# Patient Record
Sex: Male | Born: 1965
Health system: Southern US, Community
[De-identification: ages and names within clinical notes are randomized; demographics above are authoritative.]

## PROBLEM LIST (undated history)

## (undated) DIAGNOSIS — K219 Gastro-esophageal reflux disease without esophagitis: Secondary | ICD-10-CM

## (undated) DIAGNOSIS — E785 Hyperlipidemia, unspecified: Secondary | ICD-10-CM

## (undated) HISTORY — DX: Hyperlipidemia, unspecified: E78.5

## (undated) HISTORY — PX: NO PAST SURGERIES: SHX2092

## (undated) HISTORY — DX: Gastro-esophageal reflux disease without esophagitis: K21.9

---

## 2002-05-26 ENCOUNTER — Encounter: Payer: Self-pay | Admitting: *Deleted

## 2002-05-26 ENCOUNTER — Emergency Department (HOSPITAL_COMMUNITY): Admission: EM | Admit: 2002-05-26 | Discharge: 2002-05-26 | Payer: Self-pay | Admitting: *Deleted

## 2004-03-17 ENCOUNTER — Emergency Department (HOSPITAL_COMMUNITY): Admission: EM | Admit: 2004-03-17 | Discharge: 2004-03-17 | Payer: Self-pay | Admitting: Emergency Medicine

## 2004-03-19 ENCOUNTER — Encounter (HOSPITAL_COMMUNITY): Admission: RE | Admit: 2004-03-19 | Discharge: 2004-03-20 | Payer: Self-pay | Admitting: Family Medicine

## 2005-08-06 IMAGING — NM NM MYOCAR PERF WALL MOTION
1 series · 6 of 6 positions shown · non-contrast
Comparison: none

CLINICAL DATA: Chest pain. 
 MYOCARDIAL PERFUSION SPECT MULTIPLE, EJECTION FRACTION, AND WALL MOTION
 The patient underwent treadmill stress test exercising 9 minutes 53 seconds and achieving 91 percent of target heart rate (11.6 METS).  At peak exercise 30 millicuries of technetium 77m-sestamibi was injected intravenously for myocardial perfusion imaging.  Resting exam performed on second day using 20 millicuries of technetium 77m-sestamibi.  
 Myocardial perfusion SPECT images obtained after exercise were normal.  Resting exam unchanged.  No pulmonary uptake of tracer.
 Normal left ventricular ejection fraction of 68 percent calculated from the gated SPECT images after exercise.  This is derived from an end-diastolic volume calculation of 89 ml and end-systolic volume calculation of 29 ml.
 Normal wall motion.
 IMPRESSION
 Normal exam.

[Series 1: cs cardiac tc hi dose · 6.52mm/px · 6 of 512 frames shown]
[frame 43/512]
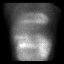
[frame 128/512]
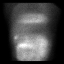
[frame 214/512]
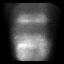
[frame 299/512]
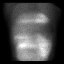
[frame 384/512]
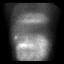
[frame 470/512]
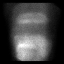

[6 of 6 positions shown; findings below may reference images not displayed]

## 2013-02-06 ENCOUNTER — Telehealth: Payer: Self-pay | Admitting: Family Medicine

## 2013-02-06 ENCOUNTER — Other Ambulatory Visit: Payer: Self-pay | Admitting: Family Medicine

## 2013-02-06 DIAGNOSIS — M25529 Pain in unspecified elbow: Secondary | ICD-10-CM

## 2013-02-06 NOTE — Telephone Encounter (Signed)
Pt seen here 09/26/12 with R arm pain and elbow pain see note in paper chart.  Was told if continued we could refer.  Pt requesting referral.  Please advise.

## 2013-02-06 NOTE — Telephone Encounter (Signed)
Patient notified that ortho referral has been processed in the system. Patient verbalized understanding.

## 2013-02-06 NOTE — Telephone Encounter (Signed)
Will refer to ortho, referral put in.Romeo Apple)

## 2013-02-22 ENCOUNTER — Ambulatory Visit (INDEPENDENT_AMBULATORY_CARE_PROVIDER_SITE_OTHER): Payer: 59 | Admitting: Orthopedic Surgery

## 2013-02-22 ENCOUNTER — Ambulatory Visit (INDEPENDENT_AMBULATORY_CARE_PROVIDER_SITE_OTHER): Payer: 59

## 2013-02-22 ENCOUNTER — Encounter: Payer: Self-pay | Admitting: Orthopedic Surgery

## 2013-02-22 VITALS — BP 136/100 | Ht 68.0 in | Wt 183.0 lb

## 2013-02-22 DIAGNOSIS — T148XXA Other injury of unspecified body region, initial encounter: Secondary | ICD-10-CM

## 2013-02-22 DIAGNOSIS — M25521 Pain in right elbow: Secondary | ICD-10-CM | POA: Insufficient documentation

## 2013-02-22 DIAGNOSIS — M25529 Pain in unspecified elbow: Secondary | ICD-10-CM

## 2013-02-22 NOTE — Patient Instructions (Addendum)
MRI f/u

## 2013-02-22 NOTE — Progress Notes (Signed)
  Subjective:    Paul Berg is a 47 y.o. male who presents with right elbow pain. Onset of the symptoms was 8 months ago. Inciting event: injury while connecting a hose on a tanker, it was hard to attach and when he twisted it he felt medial elbow pain . Current symptoms include: pain radiating to the forearm, point tenderness over the pronator teres and swelling. Pain is aggravated by: lifting heavy objects, supination/pronation as when opening doors. Symptoms have progressed to a point and plateaued. Patient has had no prior elbow problems. Evaluation to date: none. Treatment to date: ibuprofen and voltaren .  The following portions of the patient's history were reviewed and updated as appropriate: allergies, current medications, past family history, past medical history, past social history, past surgical history and problem list.  Review of Systems A comprehensive review of systems was negative. except: heartburn and snoring    Objective:    BP 136/100  Ht 5\' 8"  (1.727 m)  Wt 183 lb (83.008 kg)  BMI 27.83 kg/m2 Physical Exam(12)  1.GENERAL: normal development   2. CDV: radial and ulnar pulses are normal   3. Skin: right arm and left arm skin normal  4. Lymph: nodes were not palpable/normal in the right arm   5/6. Psychiatric: awake, alert and oriented, mood and affect normal   7. Neuro: normal sensation right arm   8.   MSK  Gait: non contributory and normal  9.   Inspection right medial elbow palpable defect medial side of elbow/ left elbow normal  10. Range of Motion normal  11. Motor weak and painful wrist flexion  12. Stability of the elbow normal    Imaging  Elbow film normal   Assessment: Pronator Teres tear right arm   Plan: MRI, patient wants repaired

## 2013-02-27 ENCOUNTER — Ambulatory Visit (HOSPITAL_COMMUNITY): Payer: 59

## 2013-02-27 ENCOUNTER — Ambulatory Visit (HOSPITAL_COMMUNITY)
Admission: RE | Admit: 2013-02-27 | Discharge: 2013-02-27 | Disposition: A | Discharge status: Home / Self Care | Payer: 59 | Source: Ambulatory Visit | Attending: Orthopedic Surgery | Admitting: Orthopedic Surgery

## 2013-02-27 DIAGNOSIS — T148XXA Other injury of unspecified body region, initial encounter: Secondary | ICD-10-CM

## 2013-02-27 DIAGNOSIS — M25521 Pain in right elbow: Secondary | ICD-10-CM

## 2013-02-27 DIAGNOSIS — M7700 Medial epicondylitis, unspecified elbow: Secondary | ICD-10-CM | POA: Insufficient documentation

## 2013-02-27 DIAGNOSIS — M25529 Pain in unspecified elbow: Secondary | ICD-10-CM | POA: Insufficient documentation

## 2013-02-27 DIAGNOSIS — M6580 Other synovitis and tenosynovitis, unspecified site: Secondary | ICD-10-CM | POA: Insufficient documentation

## 2013-03-01 ENCOUNTER — Other Ambulatory Visit (HOSPITAL_COMMUNITY): Payer: 59

## 2013-03-06 ENCOUNTER — Encounter: Payer: Self-pay | Admitting: Orthopedic Surgery

## 2013-03-06 ENCOUNTER — Ambulatory Visit (INDEPENDENT_AMBULATORY_CARE_PROVIDER_SITE_OTHER): Payer: 59 | Admitting: Orthopedic Surgery

## 2013-03-06 VITALS — BP 136/93 | Ht 68.0 in | Wt 183.0 lb

## 2013-03-06 DIAGNOSIS — M7701 Medial epicondylitis, right elbow: Secondary | ICD-10-CM

## 2013-03-06 DIAGNOSIS — M7700 Medial epicondylitis, unspecified elbow: Secondary | ICD-10-CM

## 2013-03-06 NOTE — Progress Notes (Signed)
Patient ID: Paul Berg, male   DOB: 1965-09-11, 47 y.o.   MRN: [id number] Chief Complaint  Patient presents with  . Follow-up    MRI results of right elbow.    Ht 5\' 8"  (1.727 m)  Wt 183 lb (83.008 kg)  BMI 27.83 kg/m2  MRI results right elbow  There is scarring in inflammatory tissue at the flexor pronator mass consistent with medial epicondylitis  Still having pain over the medial epicondyle and soft tissue  Review of systems no neurologic symptoms related to the ulnar nerve  Vital signs as recorded  Recommend injection  Medial epicondyle injection. (right)  Consent.  Timeout to confirm site.  Right elbow was injected 1 finger breath distal to the medial epicondyle with Depo-Medrol 40 mg and lidocaine 1% 3 cc with sterile technique using alcohol and ethyl chloride prep.  There were no complications

## 2013-03-06 NOTE — Patient Instructions (Addendum)
You have received a steroid shot. 15% of patients experience increased pain at the injection site with in the next 24 hours. This is best treated with ice and tylenol extra strength 2 tabs every 8 hours. If you are still having pain please call the office.  Medial Epicondylitis (Golfer's Elbow)  Medial epicondylitis involves inflammation and pain around the inner (medial) portion of the elbow. This pain is caused by inflammation of the tendons in the forearm that flex (bring down) the wrist. Medial epicondylitis is also called golfer's elbow, because it is common among golfers. However, it may occur in any individual who flexes the wrist regularly. If medial epicondylitis is left untreated, it may become a chronic problem. SYMPTOMS   Pain, tenderness, or inflammation over the inner (medial) side of the elbow.  Pain or weakness with gripping activities.  Pain that increases with wrist twisting motions (using a screwdriver, playing golf, bowling). CAUSES  Medial epicondylitis is caused by inflammation of the tendons that flex the wrist. Causes of injury may include:  Chronic, repetitive stress and strain to the tendons that run from the wrist and forearm to the elbow.  Sudden strain on the forearm, including wrist snap when serving balls with racquet sports, or throwing a baseball. RISK INCREASES WITH:  Sports or occupations that require repetitive and/or strenuous forearm and wrist movements (pitching a baseball, golfing, carpentry).  Poor wrist and forearm strength and flexibility.  Failure to warm up properly before activity.  Resuming activity before healing, rehabilitation, and conditioning are complete. PREVENTION   Warm up and stretch properly before activity.  Maintain physical fitness:  Strength, flexibility, and endurance.  Cardiovascular fitness.  Wear and use properly fitted equipment.  Learn and use proper technique and have a coach correct improper technique.  Wear  a tennis elbow (counterforce) brace. PROGNOSIS  The course of this condition depends on the degree of the injury. If treated properly, acute cases (symptoms lasting less than 4 weeks) are often resolved in 2 to 6 weeks. Chronic (longer lasting cases) often resolve in 3 to 6 months, but may require physical therapy. RELATED COMPLICATIONS   Frequently recurring symptoms, resulting in a chronic problem. Properly treating the problem the first time decreases frequency of recurrence.  Chronic inflammation, scarring, and partial tendon tear, requiring surgery.  Delayed healing or resolution of symptoms. TREATMENT  Treatment first involves the use of ice and medicine, to reduce pain and inflammation. Strengthening and stretching exercises may reduce discomfort, if performed regularly. These exercises may be performed at home, if the condition is an acute injury. Chronic cases may require a referral to a physical therapist for evaluation and treatment. Your caregiver may advise a corticosteroid injection to help reduce inflammation. Rarely, surgery is needed. MEDICATION  If pain medicine is needed, nonsteroidal anti-inflammatory medicines (aspirin and ibuprofen), or other minor pain relievers (acetaminophen), are often advised.  Do not take pain medicine for 7 days before surgery.  Prescription pain relievers may be given, if your caregiver thinks they are needed. Use only as directed and only as much as you need.  Corticosteroid injections may be recommended. These injections should be reserved only for the most severe cases, because they can only be given a certain number of times. HEAT AND COLD  Cold treatment (icing) should be applied for 10 to 15 minutes every 2 to 3 hours for inflammation and pain, and immediately after activity that aggravates your symptoms. Use ice packs or an ice massage.  Heat treatment may  be used before performing stretching and strengthening activities prescribed by  your caregiver, physical therapist, or athletic trainer. Use a heat pack or a warm water soak. SEEK MEDICAL CARE IF: Symptoms get worse or do not improve in 2 weeks, despite treatment.

## 2013-03-28 ENCOUNTER — Ambulatory Visit: Payer: 59 | Admitting: Orthopedic Surgery

## 2013-03-30 ENCOUNTER — Ambulatory Visit (INDEPENDENT_AMBULATORY_CARE_PROVIDER_SITE_OTHER): Payer: 59 | Admitting: Orthopedic Surgery

## 2013-03-30 ENCOUNTER — Encounter: Payer: Self-pay | Admitting: Orthopedic Surgery

## 2013-03-30 DIAGNOSIS — M7700 Medial epicondylitis, unspecified elbow: Secondary | ICD-10-CM

## 2013-03-30 DIAGNOSIS — M7701 Medial epicondylitis, right elbow: Secondary | ICD-10-CM

## 2013-03-30 NOTE — Patient Instructions (Signed)
Brace  Call office if wants to schedule surgery

## 2013-03-30 NOTE — Progress Notes (Signed)
Patient ID: Paul Berg, male   DOB: [birth year], 47 y.o.   MRN: [id number]  Chief complaint followup right elbow pain  History medial epicondyle pain treated with injection rest anti-inflammatories  . He did did help from the injection for a day and then he played golf and pain came back  He denies numbness or tingling pain with power grip  There were no vitals taken for this visit. General appearance is normal, the patient is alert and oriented x3 with normal mood and affect. The tendon area is still painful to palpation full range of motion pain with  Elbow stable motor exam normal skin intact good neurovascular exam  We discussed surgery. He wasn't think about it. I went over the instructions for wearing the tennis elbow strap which he was not wearing properly  He'll call us if he wants to have surgery  We would re\re attach the tendon after detachment and stimulation of bone.

## 2014-06-04 ENCOUNTER — Encounter (HOSPITAL_COMMUNITY): Payer: Self-pay | Admitting: *Deleted

## 2014-06-04 ENCOUNTER — Emergency Department (HOSPITAL_COMMUNITY)
Admission: EM | Admit: 2014-06-04 | Discharge: 2014-06-04 | Disposition: A | Discharge status: Home / Self Care | Payer: 59 | Attending: Emergency Medicine | Admitting: Emergency Medicine

## 2014-06-04 DIAGNOSIS — X58XXXA Exposure to other specified factors, initial encounter: Secondary | ICD-10-CM | POA: Diagnosis not present

## 2014-06-04 DIAGNOSIS — R21 Rash and other nonspecific skin eruption: Secondary | ICD-10-CM | POA: Diagnosis present

## 2014-06-04 DIAGNOSIS — Y998 Other external cause status: Secondary | ICD-10-CM | POA: Diagnosis not present

## 2014-06-04 DIAGNOSIS — T7840XA Allergy, unspecified, initial encounter: Secondary | ICD-10-CM | POA: Diagnosis not present

## 2014-06-04 DIAGNOSIS — Y9289 Other specified places as the place of occurrence of the external cause: Secondary | ICD-10-CM | POA: Insufficient documentation

## 2014-06-04 DIAGNOSIS — Y9389 Activity, other specified: Secondary | ICD-10-CM | POA: Diagnosis not present

## 2014-06-04 DIAGNOSIS — Z87891 Personal history of nicotine dependence: Secondary | ICD-10-CM | POA: Insufficient documentation

## 2014-06-04 MED ORDER — DIPHENHYDRAMINE HCL 50 MG/ML IJ SOLN
50.0000 mg | Freq: Once | INTRAMUSCULAR | Status: AC
  Administered 2014-06-04: 50 mg via INTRAVENOUS
  Filled 2014-06-04: qty 1

## 2014-06-04 MED ORDER — EPINEPHRINE 0.3 MG/0.3ML IJ SOAJ
0.3000 mg | Freq: Once | INTRAMUSCULAR | Status: AC
  Administered 2014-06-04: 0.3 mg via INTRAMUSCULAR
  Filled 2014-06-04: qty 0.3

## 2014-06-04 MED ORDER — LORATADINE 10 MG PO TABS: 10.0000 mg | ORAL_TABLET | Freq: Every day | ORAL | Status: DC

## 2014-06-04 MED ORDER — ONDANSETRON HCL 4 MG/2ML IJ SOLN
4.0000 mg | Freq: Once | INTRAMUSCULAR | Status: AC
  Administered 2014-06-04: 4 mg via INTRAVENOUS
  Filled 2014-06-04: qty 2

## 2014-06-04 MED ORDER — EPINEPHRINE 0.3 MG/0.3ML IJ SOAJ: 0.3000 mg | Freq: Once | INTRAMUSCULAR | Status: AC

## 2014-06-04 MED ORDER — SODIUM CHLORIDE 0.9 % IV BOLUS (SEPSIS)
2000.0000 mL | Freq: Once | INTRAVENOUS | Status: AC
  Administered 2014-06-04: 2000 mL via INTRAVENOUS

## 2014-06-04 MED ORDER — FAMOTIDINE 20 MG PO TABS: 20.0000 mg | ORAL_TABLET | Freq: Two times a day (BID) | ORAL | Status: DC

## 2014-06-04 MED ORDER — FAMOTIDINE IN NACL 20-0.9 MG/50ML-% IV SOLN
20.0000 mg | Freq: Once | INTRAVENOUS | Status: AC
  Administered 2014-06-04: 20 mg via INTRAVENOUS
  Filled 2014-06-04: qty 50

## 2014-06-04 MED ORDER — METHYLPREDNISOLONE SODIUM SUCC 125 MG IJ SOLR
125.0000 mg | Freq: Once | INTRAMUSCULAR | Status: AC
  Administered 2014-06-04: 125 mg via INTRAVENOUS
  Filled 2014-06-04: qty 2

## 2014-06-04 MED ORDER — PREDNISONE 20 MG PO TABS: ORAL_TABLET | ORAL | Status: DC

## 2014-06-04 MED ORDER — DIPHENHYDRAMINE HCL 25 MG PO TABS: 50.0000 mg | ORAL_TABLET | ORAL | Status: DC | PRN

## 2014-06-04 NOTE — ED Provider Notes (Signed)
CSN: [id number]     Arrival date & time 06/04/14  2131 History  This chart was scribe for No att. providers found by Judithann Sauger, ED Scribe. The patient was seen in room APA07/APA07 and the patient's care was started at 9:56 PM.   Chief Complaint  Patient presents with  . Allergic Reaction    HPI HPI Comments: Paul Berg is a 48 y.o. male who presents to the Emergency Department complaining of a generalized rash and redness that has been reoccurrent over the past few years, once every few months with the most recent onset 1 hour ago lastly a couple of hours. He explains that it starts off as wells, big pink blotches and his mouth starts watering, his tongue swells up, there is tightness in his throat, SOB, nausea and diaphoresis. He adds that his toe nails also turned gray. He reports that he is not currently on any medication but took some benadryl with slight relief PTA. There is no more tongue swelling or SOB, just the nausea, redness and rash. He felt like he was going to pass out during the break out. He denies any previous medical problems.     History reviewed. No pertinent past medical history. History reviewed. No pertinent past surgical history. History reviewed. No pertinent family history. History  Substance Use Topics  . Smoking status: Former Research scientist (life sciences)  . Smokeless tobacco: Not on file  . Alcohol Use: Yes    Review of Systems  10 Systems reviewed and are negative for acute change except as noted in the HPI.  Allergies  Pineapple  Home Medications   Prior to Admission medications   Medication Sig Start Date End Date Taking? Authorizing Provider  diphenhydrAMINE (BENADRYL) 25 MG tablet Take 2 tablets (50 mg total) by mouth every 4 (four) hours as needed for itching. 06/04/14   Babette Relic, MD  EPINEPHrine 0.3 mg/0.3 mL IJ SOAJ injection Inject 0.3 mLs (0.3 mg total) into the muscle once. 06/04/14   Babette Relic, MD  famotidine (PEPCID) 20 MG tablet Take 1  tablet (20 mg total) by mouth 2 (two) times daily. 06/04/14   Babette Relic, MD  loratadine (CLARITIN) 10 MG tablet Take 1 tablet (10 mg total) by mouth daily. One po daily x 5 days 06/04/14   Babette Relic, MD  predniSONE (DELTASONE) 20 MG tablet 2 tabs po daily x 3 days 06/04/14   Babette Relic, MD   BP 118/82 mmHg  Pulse 78  Temp(Src)   Resp 18  Ht 5\' 8"  (1.727 m)  Wt 183 lb (83.008 kg)  BMI 27.83 kg/m2  SpO2 100% Physical Exam  Constitutional:  Awake, alert, nontoxic appearance.  HENT:  Head: Atraumatic.  Eyes: Right eye exhibits no discharge. Left eye exhibits no discharge.  Neck: Neck supple.  Cardiovascular: Normal rate and regular rhythm.   No murmur heard. Pulmonary/Chest: Effort normal and breath sounds normal. No respiratory distress. He has no wheezes. He has no rales. He exhibits no tenderness.  Abdominal: Soft. There is no tenderness. There is no rebound.  Musculoskeletal: He exhibits no tenderness.  Baseline ROM, no obvious new focal weakness.  Neurological: He is alert.  Mental status and motor strength appears baseline for patient and situation.  Skin: Rash noted. There is erythema.  Generalized erythema; no purpura/petechia/vessicles  Psychiatric: He has a normal mood and affect.  Nursing note and vitals reviewed.   ED Course  Procedures (including critical care time) DIAGNOSTIC STUDIES: Oxygen  Saturation is 98% on RA, normal by my interpretation.    COORDINATION OF CARE: 10:03 PM- Pt advised of plan for treatment and pt agrees. Feels back to normal. 2335 Labs Review Labs Reviewed - No data to display  Imaging Review No results found.   EKG Interpretation None      MDM   Final diagnoses:  Allergic reaction, initial encounter    I doubt any other EMC precluding discharge at this time including, but not necessarily limited to the following:TEN. SJS, SBI.  I personally performed the services described in this documentation, which was scribed in  my presence. The recorded information has been reviewed and is accurate.    Babette Relic, MD 06/21/14 [phone]

## 2014-06-04 NOTE — ED Notes (Signed)
Patient verbalizes understanding of discharge instructions, prescription medications, home care and follow up care. Patient ambulatory out of department at this time with family. 

## 2014-06-04 NOTE — ED Notes (Signed)
Pt has generalized rash,  Took benadryl50 mg pta.  Had felt sob and throat swelling prior to taking benadryl.  Skin is red.

## 2014-12-31 ENCOUNTER — Encounter: Payer: Self-pay | Admitting: Family Medicine

## 2014-12-31 ENCOUNTER — Ambulatory Visit (INDEPENDENT_AMBULATORY_CARE_PROVIDER_SITE_OTHER): Payer: 59 | Admitting: Family Medicine

## 2014-12-31 VITALS — BP 120/80 | Ht 68.0 in | Wt 165.4 lb

## 2014-12-31 DIAGNOSIS — Z0000 Encounter for general adult medical examination without abnormal findings: Secondary | ICD-10-CM | POA: Diagnosis not present

## 2014-12-31 DIAGNOSIS — R5383 Other fatigue: Secondary | ICD-10-CM

## 2014-12-31 NOTE — Progress Notes (Signed)
   Subjective:    Patient ID: Paul Berg, male    DOB: 1966-01-09, 49 y.o.   MRN: [id number]  HPI The patient comes in today for a wellness visit.    A review of their health history was completed.  A review of medications was also completed.  Any needed refills: N/A  Eating habits: good  Falls/  MVA accidents in past few months: none  Regular exercise: yess  Specialist pt sees on regular basis: none  Preventative health issues were discussed.  Patient tries to be safe with his driving. He does try to eat relatively healthy during the week. He also tries to keep physically active and does workouts on a regular basis. Patient denies any rectal bleeding denies any hematuria. Denies chest tightness pressure pain Additional concerns: none  Review of Systems  Constitutional: Negative for fever, activity change and appetite change.  HENT: Negative for congestion and rhinorrhea.   Eyes: Negative for discharge.  Respiratory: Negative for cough and wheezing.   Cardiovascular: Negative for chest pain.  Gastrointestinal: Negative for vomiting, abdominal pain and blood in stool.  Genitourinary: Negative for frequency and difficulty urinating.  Musculoskeletal: Negative for neck pain.  Skin: Negative for rash.  Allergic/Immunologic: Negative for environmental allergies and food allergies.  Neurological: Negative for weakness and headaches.  Psychiatric/Behavioral: Negative for agitation.       Objective:   Physical Exam  Constitutional: He appears well-nourished. No distress.  Cardiovascular: Normal rate, regular rhythm and normal heart sounds.   No murmur heard. Pulmonary/Chest: Effort normal and breath sounds normal. No respiratory distress.  Musculoskeletal: He exhibits no edema.  Lymphadenopathy:    He has no cervical adenopathy.  Neurological: He is alert.  Psychiatric: His behavior is normal.  Vitals reviewed.         Assessment & Plan:  Wellness-safety  measures dietary measures discussed Patient drinks quite a few beers on the weekend he was encouraged to keep that to 4 or less per day on the weekend. He denies drinking during the week.  Patient did smoke Colonoscopy PSA not indicated until age 40.

## 2015-01-01 LAB — BASIC METABOLIC PANEL
BUN/Creatinine Ratio: 8 — ABNORMAL LOW (ref 9–20)
BUN: 10 mg/dL (ref 6–24)
CHLORIDE: 104 mmol/L (ref 97–108)
CO2: 22 mmol/L (ref 18–29)
CREATININE: 1.25 mg/dL (ref 0.76–1.27)
Calcium: 9.7 mg/dL (ref 8.7–10.2)
GFR calc non Af Amer: 68 mL/min/{1.73_m2} (ref >59)
GFR, EST AFRICAN AMERICAN: 79 mL/min/{1.73_m2} (ref >59)
Glucose: 77 mg/dL (ref 65–99)
POTASSIUM: 5 mmol/L (ref 3.5–5.2)
Sodium: 144 mmol/L (ref 134–144)

## 2015-01-01 LAB — CBC WITH DIFFERENTIAL/PLATELET
Basophils Absolute: 0.1 10*3/uL (ref 0.0–0.2)
Basos: 1 %
EOS (ABSOLUTE): 0.1 10*3/uL (ref 0.0–0.4)
Eos: 2 %
HEMATOCRIT: 48.5 % (ref 37.5–51.0)
Hemoglobin: 16.6 g/dL (ref 12.6–17.7)
Immature Grans (Abs): 0 10*3/uL (ref 0.0–0.1)
Immature Granulocytes: 0 %
LYMPHS: 33 %
Lymphocytes Absolute: 2 10*3/uL (ref 0.7–3.1)
MCH: 31.4 pg (ref 26.6–33.0)
MCHC: 34.2 g/dL (ref 31.5–35.7)
MCV: 92 fL (ref 79–97)
Monocytes Absolute: 0.6 10*3/uL (ref 0.1–0.9)
Monocytes: 9 %
NEUTROS PCT: 55 %
Neutrophils Absolute: 3.2 10*3/uL (ref 1.4–7.0)
Platelets: 257 10*3/uL (ref 150–379)
RBC: 5.29 x10E6/uL (ref 4.14–5.80)
RDW: 12.9 % (ref 12.3–15.4)
WBC: 5.9 10*3/uL (ref 3.4–10.8)

## 2015-01-01 LAB — LIPID PANEL
CHOLESTEROL TOTAL: 188 mg/dL (ref 100–199)
Chol/HDL Ratio: 4.9 ratio units (ref 0.0–5.0)
HDL: 38 mg/dL — AB (ref >39)
LDL CALC: 113 mg/dL — AB (ref 0–99)
TRIGLYCERIDES: 186 mg/dL — AB (ref 0–149)
VLDL CHOLESTEROL CAL: 37 mg/dL (ref 5–40)

## 2015-01-02 ENCOUNTER — Encounter: Payer: Self-pay | Admitting: Family Medicine

## 2018-07-26 ENCOUNTER — Encounter: Payer: 59 | Admitting: Family Medicine

## 2018-08-05 ENCOUNTER — Encounter: Payer: Self-pay | Admitting: Family Medicine

## 2018-08-05 ENCOUNTER — Ambulatory Visit (INDEPENDENT_AMBULATORY_CARE_PROVIDER_SITE_OTHER): Payer: 59 | Admitting: Family Medicine

## 2018-08-05 VITALS — BP 134/90 | Ht 68.0 in | Wt 199.4 lb

## 2018-08-05 DIAGNOSIS — Z79899 Other long term (current) drug therapy: Secondary | ICD-10-CM | POA: Diagnosis not present

## 2018-08-05 DIAGNOSIS — Z0000 Encounter for general adult medical examination without abnormal findings: Secondary | ICD-10-CM

## 2018-08-05 DIAGNOSIS — Z13220 Encounter for screening for lipoid disorders: Secondary | ICD-10-CM

## 2018-08-05 DIAGNOSIS — Z125 Encounter for screening for malignant neoplasm of prostate: Secondary | ICD-10-CM

## 2018-08-05 NOTE — Patient Instructions (Signed)
    Gabriel Cirri if you need [place] to set up the referral please let us know- Dr Nicki Reaper Thanks   It has been recommended to you that you have a colonoscopy. It is your responsibility to carry through with this recommendation.   Did you realize that colon cancer is the second leading cancer killer in the [place]. One in every 20 adults will get colon cancer. If all adults would go through the recommended screening for colon cancer (getting a colonoscopy), then there would be a 60% reduction in the number of people dying from colon cancer.  Colon cancer just doesn't come out of the blue. It starts off as a small polyp which over time grows into a cancer. A colonoscopy can prevent cancer and in many cases detected when it is at a very treatable phase. Small colon cancers can have cure rates of 95%. Advanced colon cancer, which often occurs in people who do not do their screenings, have cure rates less than 20%. The risk of colon cancer advances with age. Most adults should have regular colonoscopies every 10 years starting at age 18. This recommendation can vary depending on a person's medical history.  Health-care laws now allow for you to call the gastroenterologist office directly in order to set yourself up for this very important tests. Today we have recommended to you that you do this test. This test may save your life. Failure to do this test puts you at risk for premature death from colon cancer. Do the right thing and schedule this test now.  Here as a list of specialists we recommend in the surrounding area. When you call their office let them know that you are a patient of our practice in your interested in doing a screening colonoscopy. They should assist you without problems. You will need the following information when you called them: 1-name of which Dr. you see, 2-your insurance information, 3-a list of medications that you currently take, 4-any allergies you have to medications.  [place]  gastroenterologist Dr. Milton Ferguson, Dr Felicie Morn gastroenterologist   [facility] [place]   [phone]  [facility] gastroenterology (Dr. Garnetta Buddy and [place]) [phone]  [facility] gastroenterology (Dr. Leia Alf, Harrietta Guardian, [place]) [phone]  Each group of specialists has assured [place] that when you called them they will help you get your colonoscopy set up. Should you have problems or if the GI practice insist a referral be done please let us know. Be sure to call soon. Sincerely, Pearson Forster, Dr Mickie Hillier, [place]

## 2018-08-05 NOTE — Progress Notes (Signed)
Subjective:    Patient ID: Paul Berg, male    DOB: 1966/02/10, 53 y.o.   MRN: [id number]  HPI The patient comes in today for a wellness visit. The patient does not smoke He does drink some beer I encouraged him to keep it to less than 2 drinks per day He denies any chest tightness pressure pain shortness of breath He is due for: Screening He states that colon cancer will be screened this year he relates his wife's help setting it up   A review of their health history was completed.  A review of medications was also completed.  Any needed refills; not taking any meds  Eating habits: not health conscious  Falls/  MVA accidents in past few months: no falls but jumped off deck and hurt right ankle.   Regular exercise: none  Specialist pt sees on regular basis: none  Preventative health issues were discussed.   Additional concerns: right ankle pain after jumping off of deck 3 months ago.     Review of Systems  Constitutional: Negative for activity change, appetite change and fever.  HENT: Negative for congestion and rhinorrhea.   Eyes: Negative for discharge.  Respiratory: Negative for cough and wheezing.   Cardiovascular: Negative for chest pain.  Gastrointestinal: Negative for abdominal pain, blood in stool and vomiting.  Genitourinary: Negative for difficulty urinating and frequency.  Musculoskeletal: Negative for neck pain.  Skin: Negative for rash.  Allergic/Immunologic: Negative for environmental allergies and food allergies.  Neurological: Negative for weakness and headaches.  Psychiatric/Behavioral: Negative for agitation.       Objective:   Physical Exam Constitutional:      Appearance: He is well-developed.  HENT:     Head: Normocephalic and atraumatic.     Right Ear: External ear normal.     Left Ear: External ear normal.     Nose: Nose normal.  Eyes:     Pupils: Pupils are equal, round, and reactive to light.  Neck:     Musculoskeletal:  Normal range of motion and neck supple.     Thyroid: No thyromegaly.  Cardiovascular:     Rate and Rhythm: Normal rate and regular rhythm.     Heart sounds: Normal heart sounds. No murmur.  Pulmonary:     Effort: Pulmonary effort is normal. No respiratory distress.     Breath sounds: Normal breath sounds. No wheezing.  Abdominal:     General: Bowel sounds are normal. There is no distension.     Palpations: Abdomen is soft. There is no mass.     Tenderness: There is no abdominal tenderness.  Genitourinary:    Penis: Normal.   Musculoskeletal: Normal range of motion.  Lymphadenopathy:     Cervical: No cervical adenopathy.  Skin:    General: Skin is warm and dry.     Findings: No erythema.  Neurological:     Mental Status: He is alert.     Motor: No abnormal muscle tone.  Psychiatric:        Behavior: Behavior normal.        Judgment: Judgment normal.    Prostate exam normal       Assessment & Plan:  Adult wellness-complete.wellness physical was conducted today. Importance of diet and exercise were discussed in detail.  In addition to this a discussion regarding safety was also covered. We also reviewed over immunizations and gave recommendations regarding current immunization needed for age.  In addition to this additional areas were also touched  on including: Preventative health exams needed:  Colonoscopy colonoscopy recommended he states his wife will be setting this up he will let us know if he needs any help additional information was printed for him  He did sprain his ankle earlier this year it has had some residual swelling on the lateral aspect but has good range of motion so therefore no need for any type of intervention currently Yearly exam recommended labs recommended await the results Patient was advised yearly wellness exam

## 2018-08-06 DIAGNOSIS — Z79899 Other long term (current) drug therapy: Secondary | ICD-10-CM | POA: Diagnosis not present

## 2018-08-06 DIAGNOSIS — Z13220 Encounter for screening for lipoid disorders: Secondary | ICD-10-CM | POA: Diagnosis not present

## 2018-08-06 DIAGNOSIS — Z125 Encounter for screening for malignant neoplasm of prostate: Secondary | ICD-10-CM | POA: Diagnosis not present

## 2018-08-07 LAB — CBC WITH DIFFERENTIAL/PLATELET
Basophils Absolute: 0.1 10*3/uL (ref 0.0–0.2)
Basos: 1 %
EOS (ABSOLUTE): 0.1 10*3/uL (ref 0.0–0.4)
EOS: 2 %
Hematocrit: 44.6 % (ref 37.5–51.0)
Hemoglobin: 15.3 g/dL (ref 13.0–17.7)
Immature Grans (Abs): 0 10*3/uL (ref 0.0–0.1)
Immature Granulocytes: 1 %
Lymphocytes Absolute: 2 10*3/uL (ref 0.7–3.1)
Lymphs: 34 %
MCH: 31.3 pg (ref 26.6–33.0)
MCHC: 34.3 g/dL (ref 31.5–35.7)
MCV: 91 fL (ref 79–97)
MONOS ABS: 0.6 10*3/uL (ref 0.1–0.9)
Monocytes: 10 %
Neutrophils Absolute: 3 10*3/uL (ref 1.4–7.0)
Neutrophils: 52 %
Platelets: 252 10*3/uL (ref 150–450)
RBC: 4.89 x10E6/uL (ref 4.14–5.80)
RDW: 12.5 % (ref 11.6–15.4)
WBC: 5.8 10*3/uL (ref 3.4–10.8)

## 2018-08-07 LAB — BASIC METABOLIC PANEL
BUN/Creatinine Ratio: 10 (ref 9–20)
BUN: 13 mg/dL (ref 6–24)
CHLORIDE: 105 mmol/L (ref 96–106)
CO2: 23 mmol/L (ref 20–29)
CREATININE: 1.25 mg/dL (ref 0.76–1.27)
Calcium: 9.9 mg/dL (ref 8.7–10.2)
GFR calc Af Amer: 77 mL/min/{1.73_m2} (ref >59)
GFR calc non Af Amer: 67 mL/min/{1.73_m2} (ref >59)
GLUCOSE: 99 mg/dL (ref 65–99)
Potassium: 5.1 mmol/L (ref 3.5–5.2)
Sodium: 143 mmol/L (ref 134–144)

## 2018-08-07 LAB — HEPATIC FUNCTION PANEL
ALT: 62 IU/L — AB (ref 0–44)
AST: 40 IU/L (ref 0–40)
Albumin: 4.5 g/dL (ref 3.5–5.5)
Alkaline Phosphatase: 80 IU/L (ref 39–117)
Bilirubin Total: 0.5 mg/dL (ref 0.0–1.2)
Bilirubin, Direct: 0.1 mg/dL (ref 0.00–0.40)
Total Protein: 7.1 g/dL (ref 6.0–8.5)

## 2018-08-07 LAB — LIPID PANEL
CHOLESTEROL TOTAL: 268 mg/dL — AB (ref 100–199)
Chol/HDL Ratio: 8.6 ratio — ABNORMAL HIGH (ref 0.0–5.0)
HDL: 31 mg/dL — ABNORMAL LOW (ref >39)
LDL CALC: 198 mg/dL — AB (ref 0–99)
Triglycerides: 197 mg/dL — ABNORMAL HIGH (ref 0–149)
VLDL CHOLESTEROL CAL: 39 mg/dL (ref 5–40)

## 2018-08-07 LAB — PSA: Prostate Specific Ag, Serum: 1.2 ng/mL (ref 0.0–4.0)

## 2018-08-11 ENCOUNTER — Other Ambulatory Visit: Payer: Self-pay | Admitting: Family Medicine

## 2018-08-11 DIAGNOSIS — Z13220 Encounter for screening for lipoid disorders: Secondary | ICD-10-CM

## 2018-08-11 DIAGNOSIS — Z79899 Other long term (current) drug therapy: Secondary | ICD-10-CM

## 2018-08-11 MED ORDER — ROSUVASTATIN CALCIUM 20 MG PO TABS: 20.0000 mg | ORAL_TABLET | Freq: Every day | ORAL | 4 refills | Status: DC

## 2018-08-11 MED FILL — ROSUVASTATIN CALCIUM 20 MG: 20 | 30 days supply | Qty: 30 | Fill #0

## 2018-09-07 ENCOUNTER — Telehealth: Payer: Self-pay | Admitting: Family Medicine

## 2018-09-07 ENCOUNTER — Encounter: Payer: Self-pay | Admitting: Family Medicine

## 2018-09-07 ENCOUNTER — Other Ambulatory Visit: Payer: Self-pay | Admitting: Family Medicine

## 2018-09-07 DIAGNOSIS — Z1211 Encounter for screening for malignant neoplasm of colon: Secondary | ICD-10-CM

## 2018-09-07 NOTE — Telephone Encounter (Signed)
Please advise. Thank you

## 2018-09-07 NOTE — Telephone Encounter (Signed)
Please go ahead with screening colonoscopy referral

## 2018-09-07 NOTE — Telephone Encounter (Signed)
Referral placed.

## 2018-09-07 NOTE — Telephone Encounter (Signed)
Pt needs referral for screening colonoscopy   Please initiate referral in system so that I may process

## 2018-09-08 ENCOUNTER — Encounter: Payer: Self-pay | Admitting: Gastroenterology

## 2018-09-16 MED FILL — ROSUVASTATIN CALCIUM 20 MG: 20 | 30 days supply | Qty: 30 | Fill #1

## 2018-09-23 ENCOUNTER — Encounter: Payer: Self-pay | Admitting: Gastroenterology

## 2018-09-23 ENCOUNTER — Ambulatory Visit (AMBULATORY_SURGERY_CENTER): Payer: Self-pay | Admitting: *Deleted

## 2018-09-23 VITALS — Ht 68.0 in | Wt 205.0 lb

## 2018-09-23 DIAGNOSIS — Z1211 Encounter for screening for malignant neoplasm of colon: Secondary | ICD-10-CM

## 2018-09-23 MED ORDER — NA SULFATE-K SULFATE-MG SULF 17.5-3.13-1.6 GM/177ML PO SOLN: 1.0000 | Freq: Once | ORAL | 0 refills | Status: AC

## 2018-09-23 MED FILL — SUPREP BOWEL PREP KIT: 17.5-3.13-1 | 2 days supply | Qty: 354 | Fill #0

## 2018-09-23 NOTE — Progress Notes (Signed)
No egg or soy allergy known to patient  No  past sedation with any surgeries  or procedures, no past  intubation  No diet pills per patient No home 02 use per patient  No blood thinners per patient  Pt denies issues with constipation  No A fib or A flutter  EMMI video sent to pt's e mail - declined  Suprep $15 coupon

## 2018-10-07 ENCOUNTER — Encounter: Payer: Self-pay | Admitting: Gastroenterology

## 2018-10-07 ENCOUNTER — Ambulatory Visit (AMBULATORY_SURGERY_CENTER): Payer: 59 | Admitting: Gastroenterology

## 2018-10-07 ENCOUNTER — Other Ambulatory Visit: Payer: Self-pay

## 2018-10-07 VITALS — BP 123/85 | HR 65 | Temp 97.5°F | Resp 12 | Ht 68.0 in | Wt 205.0 lb

## 2018-10-07 DIAGNOSIS — D125 Benign neoplasm of sigmoid colon: Secondary | ICD-10-CM

## 2018-10-07 DIAGNOSIS — K621 Rectal polyp: Secondary | ICD-10-CM

## 2018-10-07 DIAGNOSIS — K635 Polyp of colon: Secondary | ICD-10-CM

## 2018-10-07 DIAGNOSIS — D128 Benign neoplasm of rectum: Secondary | ICD-10-CM

## 2018-10-07 DIAGNOSIS — Z1211 Encounter for screening for malignant neoplasm of colon: Secondary | ICD-10-CM

## 2018-10-07 MED ORDER — SODIUM CHLORIDE 0.9 % IV SOLN: 500.0000 mL | Freq: Once | INTRAVENOUS | Status: DC

## 2018-10-07 NOTE — Progress Notes (Signed)
Pt's states no medical or surgical changes since previsit or office visit. 

## 2018-10-07 NOTE — Op Note (Signed)
[name] Patient Name: Paul Berg Procedure Date: 10/07/2018 11:10 AM MRN: [id number] Endoscopist: Remo Lipps P. Paul Berg Age: 53 Referring Berg:  Date of Birth: 1966-02-27 Gender: Male Account #: [id number] Procedure:                Colonoscopy Indications:              Screening for colorectal malignant neoplasm, This                            is the patient's first colonoscopy Medicines:                Monitored Anesthesia Care Procedure:                Pre-Anesthesia Assessment:                           - Prior to the procedure, a History and Physical                            was performed, and patient medications and                            allergies were reviewed. The patient's tolerance of                            previous anesthesia was also reviewed. The risks                            and benefits of the procedure and the sedation                            options and risks were discussed with the patient.                            All questions were answered, and informed consent                            was obtained. Prior Anticoagulants: The patient has                            taken no previous anticoagulant or antiplatelet                            agents. ASA Grade Assessment: II - A patient with                            mild systemic disease. After reviewing the risks                            and benefits, the patient was deemed in                            satisfactory condition to undergo the procedure.  After obtaining informed consent, the colonoscope                            was passed under direct vision. Throughout the                            procedure, the patient's blood pressure, pulse, and                            oxygen saturations were monitored continuously. The                            Colonoscope was introduced through the anus and                            advanced to the the  cecum, identified by                            appendiceal orifice and ileocecal valve. The                            colonoscopy was performed without difficulty. The                            patient tolerated the procedure well. The quality                            of the bowel preparation was good. The ileocecal                            valve, appendiceal orifice, and rectum were                            photographed. Scope In: 11:17:00 AM Scope Out: 11:32:12 AM Scope Withdrawal Time: 0 hours 12 minutes 49 seconds  Total Procedure Duration: 0 hours 15 minutes 12 seconds  Findings:                 The perianal and digital rectal examinations were                            normal.                           A diminutive polyp was found in the transverse                            colon. The polyp was flat. The polyp was removed                            with a cold snare. Resection and retrieval were                            complete.  A 4 mm polyp was found in the rectum. The polyp was                            flat. The polyp was removed with a cold snare.                            Resection and retrieval were complete.                           Internal hemorrhoids were found during retroflexion.                           The exam was otherwise without abnormality. Complications:            No immediate complications. Estimated blood loss:                            Minimal. Estimated Blood Loss:     Estimated blood loss was minimal. Impression:               - One diminutive polyp in the transverse colon,                            removed with a cold snare. Resected and retrieved.                           - One 4 mm polyp in the rectum, removed with a cold                            snare. Resected and retrieved.                           - Internal hemorrhoids.                           - The examination was otherwise  normal. Recommendation:           - Patient has a contact number available for                            emergencies. The signs and symptoms of potential                            delayed complications were discussed with the                            patient. Return to normal activities tomorrow.                            Written discharge instructions were provided to the                            patient.                           - Resume  previous diet.                           - Continue present medications.                           - Await pathology results. Remo Lipps P. [place], Berg 10/07/2018 11:35:27 AM This report has been signed electronically.

## 2018-10-07 NOTE — Progress Notes (Signed)
Called to room to assist during endoscopic procedure.  Patient ID and intended procedure confirmed with present staff. Received instructions for my participation in the procedure from the performing physician.  

## 2018-10-07 NOTE — Patient Instructions (Signed)
Await pathology results by mail, approximately 2 weeks.  Next colonoscopy will be determined at that time.  Resume previous diet and medications today, return to your normal activities tomorrow.    YOU HAD AN ENDOSCOPIC PROCEDURE TODAY AT [facility]:   Refer to the procedure report that was given to you for any specific questions about what was found during the examination.  If the procedure report does not answer your questions, please call your gastroenterologist to clarify.  If you requested that your care partner not be given the details of your procedure findings, then the procedure report has been included in a sealed envelope for you to review at your convenience later.  YOU SHOULD EXPECT: Some feelings of bloating in the abdomen. Passage of more gas than usual.  Walking can help get rid of the air that was put into your GI tract during the procedure and reduce the bloating. If you had a lower endoscopy (such as a colonoscopy or flexible sigmoidoscopy) you may notice spotting of blood in your stool or on the toilet paper. If you underwent a bowel prep for your procedure, you may not have a normal bowel movement for a few days.  Please Note:  You might notice some irritation and congestion in your nose or some drainage.  This is from the oxygen used during your procedure.  There is no need for concern and it should clear up in a day or so.  SYMPTOMS TO REPORT IMMEDIATELY:   Following lower endoscopy (colonoscopy or flexible sigmoidoscopy):  Excessive amounts of blood in the stool  Significant tenderness or worsening of abdominal pains  Swelling of the abdomen that is new, acute  Fever of 100F or higher   For urgent or emergent issues, a gastroenterologist can be reached at any hour by calling [phone].   DIET:  We do recommend a small meal at first, but then you may proceed to your regular diet.  Drink plenty of fluids but you should avoid alcoholic beverages  for 24 hours.  ACTIVITY:  You should plan to take it easy for the rest of today and you should NOT DRIVE or use heavy machinery until tomorrow (because of the sedation medicines used during the test).    FOLLOW UP: Our staff will call the number listed on your records the next business day following your procedure to check on you and address any questions or concerns that you may have regarding the information given to you following your procedure. If we do not reach you, we will leave a message.  However, if you are feeling well and you are not experiencing any problems, there is no need to return our call.  We will assume that you have returned to your regular daily activities without incident.  If any biopsies were taken you will be contacted by phone or by letter within the next 1-3 weeks.  Please call us at [phone] if you have not heard about the biopsies in 3 weeks.    SIGNATURES/CONFIDENTIALITY: You and/or your care partner have signed paperwork which will be entered into your electronic medical record.  These signatures attest to the fact that that the information above on your After Visit Summary has been reviewed and is understood.  Full responsibility of the confidentiality of this discharge information lies with you and/or your care-partner.

## 2018-10-07 NOTE — Progress Notes (Signed)
To PACU, VSS. Report to [email or web address] 

## 2018-10-10 ENCOUNTER — Telehealth: Payer: Self-pay

## 2018-10-10 NOTE — Telephone Encounter (Signed)
NO ANSWER, MESSAGE LEFT FOR PATIENT. 

## 2018-10-10 NOTE — Telephone Encounter (Signed)
2nd phone call attempt, no answer, message left.

## 2018-10-14 MED FILL — ROSUVASTATIN CALCIUM 20 MG: 20 | 30 days supply | Qty: 30 | Fill #2

## 2018-11-18 MED FILL — ROSUVASTATIN CALCIUM 20 MG: 20 | 30 days supply | Qty: 30 | Fill #3

## 2018-12-27 MED FILL — ROSUVASTATIN CALCIUM 20 MG: 20 | 30 days supply | Qty: 30 | Fill #4

## 2019-01-20 ENCOUNTER — Other Ambulatory Visit: Payer: Self-pay | Admitting: Family Medicine

## 2019-01-21 NOTE — Telephone Encounter (Signed)
Patient may have this +1 additional refill needs lipid liver profile within the next month and a follow-up virtual visit

## 2019-01-24 ENCOUNTER — Other Ambulatory Visit: Payer: Self-pay | Admitting: *Deleted

## 2019-01-24 DIAGNOSIS — Z79899 Other long term (current) drug therapy: Secondary | ICD-10-CM

## 2019-01-24 DIAGNOSIS — Z13220 Encounter for screening for lipoid disorders: Secondary | ICD-10-CM

## 2019-01-24 DIAGNOSIS — E785 Hyperlipidemia, unspecified: Secondary | ICD-10-CM

## 2019-01-24 NOTE — Telephone Encounter (Signed)
bw orders put in. Left message to return call to notify pt and then will send in refill.

## 2019-02-15 MED FILL — ROSUVASTATIN CALCIUM 20 MG: 20 | 30 days supply | Qty: 30 | Fill #0

## 2019-03-11 MED FILL — ROSUVASTATIN CALCIUM 20 MG: 20 | 30 days supply | Qty: 30 | Fill #1

## 2019-04-05 ENCOUNTER — Encounter: Payer: Self-pay | Admitting: Family Medicine

## 2019-04-06 ENCOUNTER — Other Ambulatory Visit: Payer: Self-pay | Admitting: *Deleted

## 2019-04-06 MED ORDER — ROSUVASTATIN CALCIUM 20 MG PO TABS: 20.0000 mg | ORAL_TABLET | Freq: Every day | ORAL | 0 refills | Status: DC

## 2019-04-06 MED FILL — ROSUVASTATIN CALCIUM 20 MG: 20 | 30 days supply | Qty: 30 | Fill #0

## 2019-04-06 NOTE — Telephone Encounter (Signed)
Nurses I reviewed over his lab work He may have a 90-day refill of his medicine Truman Hayward will need to do wellness and follow-up on hyperlipidemia in early January Please send him a MyChart message letting him know we sent in his refill and he will need to do follow-up in January Thanks-Dr. Nicki Reaper

## 2019-06-12 MED FILL — ROSUVASTATIN CALCIUM 20 MG: 20 | 30 days supply | Qty: 30 | Fill #1

## 2019-07-11 MED FILL — ROSUVASTATIN CALCIUM 20 MG: 20 | 30 days supply | Qty: 30 | Fill #2

## 2019-08-28 ENCOUNTER — Other Ambulatory Visit: Payer: Self-pay | Admitting: Family Medicine

## 2019-08-28 MED FILL — ROSUVASTATIN CALCIUM 20 MG: 20 | 30 days supply | Qty: 30 | Fill #0

## 2019-08-28 NOTE — Telephone Encounter (Signed)
Scheduled CPE 2/17

## 2019-08-28 NOTE — Telephone Encounter (Signed)
lvm to schedule physical

## 2019-08-28 NOTE — Telephone Encounter (Signed)
Please contact patient to set up virtual appt; then may route back to nurses. Thank you

## 2019-09-13 ENCOUNTER — Ambulatory Visit (INDEPENDENT_AMBULATORY_CARE_PROVIDER_SITE_OTHER): Payer: BC Managed Care – PPO | Admitting: Family Medicine

## 2019-09-13 ENCOUNTER — Other Ambulatory Visit: Payer: Self-pay

## 2019-09-13 ENCOUNTER — Encounter: Payer: Self-pay | Admitting: Family Medicine

## 2019-09-13 VITALS — BP 128/82 | Temp 98.0°F | Ht 68.0 in | Wt 204.0 lb

## 2019-09-13 DIAGNOSIS — Z79899 Other long term (current) drug therapy: Secondary | ICD-10-CM

## 2019-09-13 DIAGNOSIS — E785 Hyperlipidemia, unspecified: Secondary | ICD-10-CM | POA: Diagnosis not present

## 2019-09-13 DIAGNOSIS — Z125 Encounter for screening for malignant neoplasm of prostate: Secondary | ICD-10-CM | POA: Diagnosis not present

## 2019-09-13 DIAGNOSIS — Z0000 Encounter for general adult medical examination without abnormal findings: Secondary | ICD-10-CM | POA: Diagnosis not present

## 2019-09-13 MED ORDER — ROSUVASTATIN CALCIUM 20 MG PO TABS: 20.0000 mg | ORAL_TABLET | Freq: Every day | ORAL | 3 refills | Status: DC

## 2019-09-13 NOTE — Patient Instructions (Signed)
DASH Eating Plan DASH stands for "Dietary Approaches to Stop Hypertension." The DASH eating plan is a healthy eating plan that has been shown to reduce high blood pressure (hypertension). It may also reduce your risk for type 2 diabetes, heart disease, and stroke. The DASH eating plan may also help with weight loss. What are tips for following this plan?  General guidelines  Avoid eating more than 2,300 mg (milligrams) of salt (sodium) a day. If you have hypertension, you may need to reduce your sodium intake to 1,500 mg a day.  Limit alcohol intake to no more than 1 drink a day for nonpregnant women and 2 drinks a day for men. One drink equals 12 oz of beer, 5 oz of wine, or 1 oz of hard liquor.  Work with your health care provider to maintain a healthy body weight or to lose weight. Ask what an ideal weight is for you.  Get at least 30 minutes of exercise that causes your heart to beat faster (aerobic exercise) most days of the week. Activities may include walking, swimming, or biking.  Work with your health care provider or diet and nutrition specialist (dietitian) to adjust your eating plan to your individual calorie needs. Reading food labels   Check food labels for the amount of sodium per serving. Choose foods with less than 5 percent of the Daily Value of sodium. Generally, foods with less than 300 mg of sodium per serving fit into this eating plan.  To find whole grains, look for the word "whole" as the first word in the ingredient list. Shopping  Buy products labeled as "low-sodium" or "no salt added."  Buy fresh foods. Avoid canned foods and premade or frozen meals. Cooking  Avoid adding salt when cooking. Use salt-free seasonings or herbs instead of table salt or sea salt. Check with your health care provider or pharmacist before using salt substitutes.  Do not fry foods. Cook foods using healthy methods such as baking, boiling, grilling, and broiling instead.  Cook with  heart-healthy oils, such as olive, canola, soybean, or sunflower oil. Meal planning  Eat a balanced diet that includes: ? 5 or more servings of fruits and vegetables each day. At each meal, try to fill half of your plate with fruits and vegetables. ? Up to 6-8 servings of whole grains each day. ? Less than 6 oz of lean meat, poultry, or fish each day. A 3-oz serving of meat is about the same size as a deck of cards. One egg equals 1 oz. ? 2 servings of low-fat dairy each day. ? A serving of nuts, seeds, or beans 5 times each week. ? Heart-healthy fats. Healthy fats called Omega-3 fatty acids are found in foods such as flaxseeds and coldwater fish, like sardines, salmon, and mackerel.  Limit how much you eat of the following: ? Canned or prepackaged foods. ? Food that is high in trans fat, such as fried foods. ? Food that is high in saturated fat, such as fatty meat. ? Sweets, desserts, sugary drinks, and other foods with added sugar. ? Full-fat dairy products.  Do not salt foods before eating.  Try to eat at least 2 vegetarian meals each week.  Eat more home-cooked food and less restaurant, buffet, and fast food.  When eating at a restaurant, ask that your food be prepared with less salt or no salt, if possible. What foods are recommended? The items listed may not be a complete list. Talk with your dietitian about   what dietary choices are best for you. Grains Whole-grain or whole-wheat bread. Whole-grain or whole-wheat pasta. Brown rice. Oatmeal. Quinoa. Bulgur. Whole-grain and low-sodium cereals. Pita bread. Low-fat, low-sodium crackers. Whole-wheat flour tortillas. Vegetables Fresh or frozen vegetables (raw, steamed, roasted, or grilled). Low-sodium or reduced-sodium tomato and vegetable juice. Low-sodium or reduced-sodium tomato sauce and tomato paste. Low-sodium or reduced-sodium canned vegetables. Fruits All fresh, dried, or frozen fruit. Canned fruit in natural juice (without  added sugar). Meat and other protein foods Skinless chicken or turkey. Ground chicken or turkey. Pork with fat trimmed off. Fish and seafood. Egg whites. Dried beans, peas, or lentils. Unsalted nuts, nut butters, and seeds. Unsalted canned beans. Lean cuts of beef with fat trimmed off. Low-sodium, lean deli meat. Dairy Low-fat (1%) or fat-free (skim) milk. Fat-free, low-fat, or reduced-fat cheeses. Nonfat, low-sodium ricotta or cottage cheese. Low-fat or nonfat yogurt. Low-fat, low-sodium cheese. Fats and oils Soft margarine without trans fats. Vegetable oil. Low-fat, reduced-fat, or light mayonnaise and salad dressings (reduced-sodium). Canola, safflower, olive, soybean, and sunflower oils. Avocado. Seasoning and other foods Herbs. Spices. Seasoning mixes without salt. Unsalted popcorn and pretzels. Fat-free sweets. What foods are not recommended? The items listed may not be a complete list. Talk with your dietitian about what dietary choices are best for you. Grains Baked goods made with fat, such as croissants, muffins, or some breads. Dry pasta or rice meal packs. Vegetables Creamed or fried vegetables. Vegetables in a cheese sauce. Regular canned vegetables (not low-sodium or reduced-sodium). Regular canned tomato sauce and paste (not low-sodium or reduced-sodium). Regular tomato and vegetable juice (not low-sodium or reduced-sodium). Pickles. Olives. Fruits Canned fruit in a light or heavy syrup. Fried fruit. Fruit in cream or butter sauce. Meat and other protein foods Fatty cuts of meat. Ribs. Fried meat. Bacon. Sausage. Bologna and other processed lunch meats. Salami. Fatback. Hotdogs. Bratwurst. Salted nuts and seeds. Canned beans with added salt. Canned or smoked fish. Whole eggs or egg yolks. Chicken or turkey with skin. Dairy Whole or 2% milk, cream, and half-and-half. Whole or full-fat cream cheese. Whole-fat or sweetened yogurt. Full-fat cheese. Nondairy creamers. Whipped toppings.  Processed cheese and cheese spreads. Fats and oils Butter. Stick margarine. Lard. Shortening. Ghee. Bacon fat. Tropical oils, such as coconut, palm kernel, or palm oil. Seasoning and other foods Salted popcorn and pretzels. Onion salt, garlic salt, seasoned salt, table salt, and sea salt. Worcestershire sauce. Tartar sauce. Barbecue sauce. Teriyaki sauce. Soy sauce, including reduced-sodium. Steak sauce. Canned and packaged gravies. Fish sauce. Oyster sauce. Cocktail sauce. Horseradish that you find on the shelf. Ketchup. Mustard. Meat flavorings and tenderizers. Bouillon cubes. Hot sauce and Tabasco sauce. Premade or packaged marinades. Premade or packaged taco seasonings. Relishes. Regular salad dressings. Where to find more information:  National Heart, Lung, and Blood Institute: www.nhlbi.nih.gov  American Heart Association: www.heart.org Summary  The DASH eating plan is a healthy eating plan that has been shown to reduce high blood pressure (hypertension). It may also reduce your risk for type 2 diabetes, heart disease, and stroke.  With the DASH eating plan, you should limit salt (sodium) intake to 2,300 mg a day. If you have hypertension, you may need to reduce your sodium intake to 1,500 mg a day.  When on the DASH eating plan, aim to eat more fresh fruits and vegetables, whole grains, lean proteins, low-fat dairy, and heart-healthy fats.  Work with your health care provider or diet and nutrition specialist (dietitian) to adjust your eating plan to your   individual calorie needs. This information is not intended to replace advice given to you by your health care provider. Make sure you discuss any questions you have with your health care provider. Document Revised: 06/25/2017 Document Reviewed: 07/06/2016 Elsevier Patient Education  2020 Elsevier Inc.  

## 2019-09-13 NOTE — Progress Notes (Signed)
Subjective:    Patient ID: Paul Berg, male    DOB: Jul 10, 1966, 54 y.o.   MRN: [id number]  HPI  The patient comes in today for a wellness visit.    A review of their health history was completed.  A review of medications was also completed.  Any needed refills; yes  Eating habits: eats what he wants  Falls/  MVA accidents in past few months: none  Regular exercise: physical job Patient states he has a skin tag on the left side of his neck Specialist pt sees on regular basis: no  Preventative health issues were discussed.   Additional concerns: check mole on left side of neck On examination this is a skin tag I recommend removal he will schedule for a later date Healthy diet recommended minimize salt stay more active maximize vegetables try to increase cardio exercise  Review of Systems  Constitutional: Negative for activity change, appetite change and fever.  HENT: Negative for congestion and rhinorrhea.   Eyes: Negative for discharge.  Respiratory: Negative for cough and wheezing.   Cardiovascular: Negative for chest pain.  Gastrointestinal: Negative for abdominal pain, blood in stool and vomiting.  Genitourinary: Negative for difficulty urinating and frequency.  Musculoskeletal: Negative for neck pain.  Skin: Negative for rash.  Allergic/Immunologic: Negative for environmental allergies and food allergies.  Neurological: Negative for weakness and headaches.  Psychiatric/Behavioral: Negative for agitation.       Objective:   Physical Exam Constitutional:      Appearance: He is well-developed.  HENT:     Head: Normocephalic and atraumatic.     Right Ear: External ear normal.     Left Ear: External ear normal.     Nose: Nose normal.  Eyes:     Pupils: Pupils are equal, round, and reactive to light.  Neck:     Thyroid: No thyromegaly.  Cardiovascular:     Rate and Rhythm: Normal rate and regular rhythm.     Heart sounds: Normal heart sounds. No murmur.   Pulmonary:     Effort: Pulmonary effort is normal. No respiratory distress.     Breath sounds: Normal breath sounds. No wheezing.  Abdominal:     General: Bowel sounds are normal. There is no distension.     Palpations: Abdomen is soft. There is no mass.     Tenderness: There is no abdominal tenderness.  Genitourinary:    Penis: Normal.   Musculoskeletal:        General: Normal range of motion.     Cervical back: Normal range of motion and neck supple.  Lymphadenopathy:     Cervical: No cervical adenopathy.  Skin:    General: Skin is warm and dry.     Findings: No erythema.  Neurological:     Mental Status: He is alert.     Motor: No abnormal muscle tone.  Psychiatric:        Behavior: Behavior normal.        Judgment: Judgment normal.    Patient defers on rectal exam       Assessment & Plan:  Patient agrees to do blood work He will watch his diet try to be more active Minimize alcohol Try to lose some weight Patient's blood pressure is borderline when I checked it I encouraged him to do his best to minimize salt follow-up in the summer if is still elevated I would recommend medication Continue cholesterol medicine Adult wellness-complete.wellness physical was conducted today. Importance of diet and  exercise were discussed in detail.  In addition to this a discussion regarding safety was also covered. We also reviewed over immunizations and gave recommendations regarding current immunization needed for age.  In addition to this additional areas were also touched on including: Preventative health exams needed:  Colonoscopy patient had colonoscopy last year.  Next one will be in 10 years  Patient was advised yearly wellness exam

## 2019-09-14 LAB — LIPID PANEL
Chol/HDL Ratio: 3.8 ratio (ref 0.0–5.0)
Cholesterol, Total: 129 mg/dL (ref 100–199)
HDL: 34 mg/dL — ABNORMAL LOW (ref >39)
LDL Chol Calc (NIH): 59 mg/dL (ref 0–99)
Triglycerides: 221 mg/dL — ABNORMAL HIGH (ref 0–149)
VLDL Cholesterol Cal: 36 mg/dL (ref 5–40)

## 2019-09-14 LAB — BASIC METABOLIC PANEL
BUN/Creatinine Ratio: 15 (ref 9–20)
BUN: 16 mg/dL (ref 6–24)
CO2: 25 mmol/L (ref 20–29)
Calcium: 9.6 mg/dL (ref 8.7–10.2)
Chloride: 101 mmol/L (ref 96–106)
Creatinine, Ser: 1.1 mg/dL (ref 0.76–1.27)
GFR calc Af Amer: 89 mL/min/{1.73_m2} (ref >59)
GFR calc non Af Amer: 77 mL/min/{1.73_m2} (ref >59)
Glucose: 90 mg/dL (ref 65–99)
Potassium: 4.3 mmol/L (ref 3.5–5.2)
Sodium: 139 mmol/L (ref 134–144)

## 2019-09-14 LAB — HEPATIC FUNCTION PANEL
ALT: 38 IU/L (ref 0–44)
AST: 27 IU/L (ref 0–40)
Albumin: 4.7 g/dL (ref 3.8–4.9)
Alkaline Phosphatase: 87 IU/L (ref 39–117)
Bilirubin Total: 0.6 mg/dL (ref 0.0–1.2)
Bilirubin, Direct: 0.17 mg/dL (ref 0.00–0.40)
Total Protein: 7 g/dL (ref 6.0–8.5)

## 2019-09-14 LAB — PSA: Prostate Specific Ag, Serum: 1.3 ng/mL (ref 0.0–4.0)

## 2023-02-25 ENCOUNTER — Ambulatory Visit (INDEPENDENT_AMBULATORY_CARE_PROVIDER_SITE_OTHER): Payer: BC Managed Care – PPO | Admitting: Family Medicine

## 2023-02-25 VITALS — BP 141/95 | HR 73 | Temp 97.5°F | Ht 68.0 in | Wt 194.0 lb

## 2023-02-25 DIAGNOSIS — I10 Essential (primary) hypertension: Secondary | ICD-10-CM | POA: Diagnosis not present

## 2023-02-25 DIAGNOSIS — E785 Hyperlipidemia, unspecified: Secondary | ICD-10-CM

## 2023-02-25 DIAGNOSIS — Z0001 Encounter for general adult medical examination with abnormal findings: Secondary | ICD-10-CM

## 2023-02-25 DIAGNOSIS — Z0000 Encounter for general adult medical examination without abnormal findings: Secondary | ICD-10-CM

## 2023-02-25 MED ORDER — AMLODIPINE BESYLATE 5 MG PO TABS: 5.0000 mg | ORAL_TABLET | Freq: Every day | ORAL | 5 refills | Status: DC

## 2023-02-25 NOTE — Progress Notes (Signed)
   Subjective:    Patient ID: Paul Berg, male    DOB: [birth year], 57 y.o.   MRN: [id number]  HPI The patient comes in today for a wellness visit.    Things function Yes exactly as point okay had to review your health wellness okay Here today for physical Denies being depressed Works a lot   A review of their health history was completed.  A review of medications was also completed.  Any needed refills; no He denies smoking but he does relate he has 4-5 beers every evening we did talk about cutting back on alcohol how that would help his blood pressure He states he gets occasional headaches no unilateral numbness weakness Eating habits: fair  Falls/  MVA accidents in past few months: no  Regular exercise: works regularly  Specialist pt sees on regular basis: no  Preventative health issues were discussed.   Additional concerns: HTN and labs - not taking cholesterol meds   Review of Systems     Objective:   Physical Exam  General-in no acute distress Eyes-no discharge Lungs-respiratory rate normal, CTA CV-no murmurs,RRR Extremities skin warm dry no edema Neuro grossly normal Behavior normal, alert Prostate exam normal slightly enlarged soft      Assessment & Plan:  1. Well adult exam Adult wellness-complete.wellness physical was conducted today. Importance of diet and exercise were discussed in detail.  Importance of stress reduction and healthy living were discussed.  In addition to this a discussion regarding safety was also covered.  We also reviewed over immunizations and gave recommendations regarding current immunization needed for age.   In addition to this additional areas were also touched on including: Preventative health exams needed:  Colonoscopy 2030  Patient was advised yearly wellness exam  - Basic Metabolic Panel - Lipid Panel - PSA - Microalbumin/Creatinine Ratio, Urine - CT CARDIAC SCORING (SELF PAY ONLY)  2. Primary  hypertension Portion control regular physical activity amlodipine 5 mg follow-up 4 to 6 weeks to recheck blood pressure - Basic Metabolic Panel - Microalbumin/Creatinine Ratio, Urine - CT CARDIAC SCORING (SELF PAY ONLY)  3. Hyperlipidemia, unspecified hyperlipidemia type Check lipid profile may well need to be on statin as well - Lipid Panel - CT CARDIAC SCORING (SELF PAY ONLY)

## 2023-02-25 NOTE — Patient Instructions (Signed)

## 2023-03-03 ENCOUNTER — Telehealth: Payer: Self-pay | Admitting: Family Medicine

## 2023-03-03 NOTE — Telephone Encounter (Signed)
Patient was seen 8/1 and put on BP medication. He states pressure last night was 168/116 and this morning it 152/106. Please advise

## 2023-03-03 NOTE — Telephone Encounter (Signed)
Based on the numbers blood pressure is not within the goal range May increase amlodipine New dose of 10 mg 1 daily, #30 with 4 refills Recommend follow-up visit within 4 to 6 weeks to check blood pressure Please alert the patient a small percent of people 7 out of 100 could have swelling in the lower legs associated with a higher dose if that occurs notify us we may have to change dosing

## 2023-03-04 MED ORDER — AMLODIPINE BESYLATE 10 MG PO TABS: 10.0000 mg | ORAL_TABLET | Freq: Every day | ORAL | 4 refills | Status: DC

## 2023-03-04 NOTE — Telephone Encounter (Signed)
Called patient and phone is no longer in service. Sent message via MyChart.

## 2023-03-12 ENCOUNTER — Ambulatory Visit (HOSPITAL_COMMUNITY)
Admission: RE | Admit: 2023-03-12 | Discharge: 2023-03-12 | Disposition: A | Discharge status: Home / Self Care | Payer: BC Managed Care – PPO | Source: Ambulatory Visit | Attending: Family Medicine | Admitting: Family Medicine

## 2023-03-12 DIAGNOSIS — I10 Essential (primary) hypertension: Secondary | ICD-10-CM | POA: Diagnosis not present

## 2023-03-12 DIAGNOSIS — Z0000 Encounter for general adult medical examination without abnormal findings: Secondary | ICD-10-CM | POA: Insufficient documentation

## 2023-03-12 DIAGNOSIS — E785 Hyperlipidemia, unspecified: Secondary | ICD-10-CM | POA: Diagnosis not present

## 2023-03-12 DIAGNOSIS — Z136 Encounter for screening for cardiovascular disorders: Secondary | ICD-10-CM | POA: Diagnosis not present

## 2023-03-14 LAB — LIPID PANEL: Triglycerides: 292 mg/dL — ABNORMAL HIGH (ref 0–149)

## 2023-04-01 ENCOUNTER — Encounter: Payer: Self-pay | Admitting: Family Medicine

## 2023-04-01 NOTE — Progress Notes (Signed)
Please mail to patient

## 2023-05-05 ENCOUNTER — Telehealth: Payer: Self-pay | Admitting: Family Medicine

## 2023-05-05 NOTE — Telephone Encounter (Signed)
Tried calling back , number not in service, and wife's number 's voicemail is full. Was not able to leave a message

## 2023-05-05 NOTE — Telephone Encounter (Signed)
Results of scans

## 2023-08-20 ENCOUNTER — Other Ambulatory Visit: Payer: Self-pay | Admitting: Family Medicine

## 2023-11-12 ENCOUNTER — Ambulatory Visit: Payer: BC Managed Care – PPO | Admitting: Family Medicine

## 2024-01-24 ENCOUNTER — Other Ambulatory Visit: Payer: Self-pay | Admitting: Family Medicine

## 2024-02-04 ENCOUNTER — Ambulatory Visit (INDEPENDENT_AMBULATORY_CARE_PROVIDER_SITE_OTHER): Admitting: Family Medicine

## 2024-02-04 ENCOUNTER — Encounter: Payer: Self-pay | Admitting: Family Medicine

## 2024-02-04 VITALS — BP 130/88 | HR 67 | Temp 97.9°F | Ht 68.0 in | Wt 190.4 lb

## 2024-02-04 DIAGNOSIS — Z79899 Other long term (current) drug therapy: Secondary | ICD-10-CM | POA: Diagnosis not present

## 2024-02-04 DIAGNOSIS — I10 Essential (primary) hypertension: Secondary | ICD-10-CM | POA: Diagnosis not present

## 2024-02-04 DIAGNOSIS — Z125 Encounter for screening for malignant neoplasm of prostate: Secondary | ICD-10-CM

## 2024-02-04 DIAGNOSIS — E785 Hyperlipidemia, unspecified: Secondary | ICD-10-CM

## 2024-02-04 NOTE — Progress Notes (Signed)
   Subjective:    Patient ID: ZAKHARI FOGEL, male    DOB: 1966/04/25, 58 y.o.   MRN: [id number]  HPI Pt comes in today for follow up visit to check blood pressure medication. Medications and allergies reviewed. Patient states he is going to try to eat healthier and stay more physically active overall he feels he is doing well with his medicine denies any setbacks chest tightness or shortness of breath no rectal bleeding urination going well  Review of Systems     Objective:   Physical Exam  General-in no acute distress Eyes-no discharge Lungs-respiratory rate normal, CTA CV-no murmurs,RRR Extremities skin warm dry no edema Neuro grossly normal Behavior normal, alert       Assessment & Plan:  1. Primary hypertension (Primary) Blood pressure the diastolic is little higher than what I would like to see we offered adding more medicine patient would like to clean up his diet to eat healthier minimize alcohol and fit and walking - Basic Metabolic Panel - Microalbumin/Creatinine Ratio, Urine  2. Hyperlipidemia, unspecified hyperlipidemia type Hyperlipidemia, patient not currently taking his cholesterol medicine he states he is going work hard on diet check labs in about 10 to 12 weeks he may well need to be back on medicine I believe he has a genetic component as well - Lipid Panel  3. High risk medication use Labs ordered - Hepatic Function Panel  4. Screening PSA (prostate specific antigen) Labs ordered - PSA  Follow-up 6 months

## 2024-03-13 DIAGNOSIS — I10 Essential (primary) hypertension: Secondary | ICD-10-CM | POA: Diagnosis not present

## 2024-03-13 DIAGNOSIS — Z79899 Other long term (current) drug therapy: Secondary | ICD-10-CM | POA: Diagnosis not present

## 2024-03-13 DIAGNOSIS — Z125 Encounter for screening for malignant neoplasm of prostate: Secondary | ICD-10-CM | POA: Diagnosis not present

## 2024-03-13 DIAGNOSIS — E785 Hyperlipidemia, unspecified: Secondary | ICD-10-CM | POA: Diagnosis not present

## 2024-03-14 ENCOUNTER — Ambulatory Visit: Payer: Self-pay | Admitting: Family Medicine

## 2024-03-14 LAB — LIPID PANEL
Chol/HDL Ratio: 7.2 ratio — ABNORMAL HIGH (ref 0.0–5.0)
Cholesterol, Total: 267 mg/dL — ABNORMAL HIGH (ref 100–199)
HDL: 37 mg/dL — ABNORMAL LOW (ref >39)
LDL Chol Calc (NIH): 156 mg/dL — ABNORMAL HIGH (ref 0–99)
Triglycerides: 388 mg/dL — ABNORMAL HIGH (ref 0–149)
VLDL Cholesterol Cal: 74 mg/dL — ABNORMAL HIGH (ref 5–40)

## 2024-03-14 LAB — BASIC METABOLIC PANEL WITH GFR
BUN/Creatinine Ratio: 8 — ABNORMAL LOW (ref 9–20)
BUN: 8 mg/dL (ref 6–24)
CO2: 23 mmol/L (ref 20–29)
Calcium: 9.9 mg/dL (ref 8.7–10.2)
Chloride: 99 mmol/L (ref 96–106)
Creatinine, Ser: 1.02 mg/dL (ref 0.76–1.27)
Glucose: 100 mg/dL — ABNORMAL HIGH (ref 70–99)
Potassium: 4.3 mmol/L (ref 3.5–5.2)
Sodium: 136 mmol/L (ref 134–144)
eGFR: 86 mL/min/1.73 (ref >59)

## 2024-03-14 LAB — HEPATIC FUNCTION PANEL
ALT: 39 IU/L (ref 0–44)
AST: 30 IU/L (ref 0–40)
Albumin: 4.7 g/dL (ref 3.8–4.9)
Alkaline Phosphatase: 113 IU/L (ref 44–121)
Bilirubin Total: 0.6 mg/dL (ref 0.0–1.2)
Bilirubin, Direct: 0.17 mg/dL (ref 0.00–0.40)
Total Protein: 7.4 g/dL (ref 6.0–8.5)

## 2024-03-14 LAB — MICROALBUMIN / CREATININE URINE RATIO
Creatinine, Urine: 53.4 mg/dL
Microalb/Creat Ratio: 11 mg/g{creat} (ref 0–29)
Microalbumin, Urine: 5.7 ug/mL

## 2024-03-14 LAB — PSA: Prostate Specific Ag, Serum: 1.4 ng/mL (ref 0.0–4.0)

## 2024-06-27 ENCOUNTER — Other Ambulatory Visit: Payer: Self-pay | Admitting: Family Medicine

## 2024-08-04 ENCOUNTER — Ambulatory Visit: Admitting: Family Medicine

## 2024-08-04 VITALS — BP 128/78 | HR 91 | Temp 98.2°F | Ht 68.0 in | Wt 196.6 lb

## 2024-08-04 DIAGNOSIS — E785 Hyperlipidemia, unspecified: Secondary | ICD-10-CM | POA: Diagnosis not present

## 2024-08-04 DIAGNOSIS — I10 Essential (primary) hypertension: Secondary | ICD-10-CM | POA: Diagnosis not present

## 2024-08-04 DIAGNOSIS — Z79899 Other long term (current) drug therapy: Secondary | ICD-10-CM | POA: Diagnosis not present

## 2024-08-04 MED ORDER — AMLODIPINE BESYLATE 10 MG PO TABS: 10.0000 mg | ORAL_TABLET | Freq: Every day | ORAL | 4 refills | Status: AC

## 2024-08-04 NOTE — Progress Notes (Signed)
" ° °  Subjective:    Patient ID: Paul Berg, male    DOB: December 29, 1965, 59 y.o.   MRN: [id number]  HPI No C/O - stated he did not take his BP medication last night. Very nice patient Has history of hyperlipidemia and hypertension States he enjoys eating hamburger and fast food but at same time tries to be reasonably healthy Takes his medicine regular basis but he did forgot to take his blood pressure medicine last night but he took it earlier today He is not taking his cholesterol medicine currently Previously cholesterol significantly elevated but coronary calcium  was normal  Review of Systems     Objective:   Physical Exam  General-in no acute distress Eyes-no discharge Lungs-respiratory rate normal, CTA CV-no murmurs,RRR Extremities skin warm dry no edema Neuro grossly normal Behavior normal, alert   Blood pressure is very good on recheck     Assessment & Plan:  HTN-blood pressure very good on recheck Try to make good choices Stay active Continue medicine  Hyperlipidemia check labs patient states that if his cholesterol is still significantly elevated he is willing to go back on statin Follow-up 6 months sooner problems  "

## 2024-08-05 ENCOUNTER — Ambulatory Visit: Payer: Self-pay | Admitting: Family Medicine

## 2024-08-05 ENCOUNTER — Other Ambulatory Visit: Payer: Self-pay | Admitting: Family Medicine

## 2024-08-05 DIAGNOSIS — I10 Essential (primary) hypertension: Secondary | ICD-10-CM

## 2024-08-05 DIAGNOSIS — E785 Hyperlipidemia, unspecified: Secondary | ICD-10-CM

## 2024-08-05 DIAGNOSIS — Z79899 Other long term (current) drug therapy: Secondary | ICD-10-CM

## 2024-08-05 LAB — LIPID PANEL
Chol/HDL Ratio: 10 ratio — ABNORMAL HIGH (ref 0.0–5.0)
Cholesterol, Total: 279 mg/dL — ABNORMAL HIGH (ref 100–199)
HDL: 28 mg/dL — ABNORMAL LOW
LDL Chol Calc (NIH): 181 mg/dL — ABNORMAL HIGH (ref 0–99)
Triglycerides: 355 mg/dL — ABNORMAL HIGH (ref 0–149)
VLDL Cholesterol Cal: 70 mg/dL — ABNORMAL HIGH (ref 5–40)

## 2024-08-05 LAB — BASIC METABOLIC PANEL WITH GFR
BUN/Creatinine Ratio: 10 (ref 9–20)
BUN: 13 mg/dL (ref 6–24)
CO2: 25 mmol/L (ref 20–29)
Calcium: 10.7 mg/dL — ABNORMAL HIGH (ref 8.7–10.2)
Chloride: 102 mmol/L (ref 96–106)
Creatinine, Ser: 1.29 mg/dL — ABNORMAL HIGH (ref 0.76–1.27)
Glucose: 89 mg/dL (ref 70–99)
Potassium: 4.8 mmol/L (ref 3.5–5.2)
Sodium: 142 mmol/L (ref 134–144)
eGFR: 65 mL/min/1.73

## 2024-08-05 LAB — HEPATIC FUNCTION PANEL
ALT: 62 IU/L — ABNORMAL HIGH (ref 0–44)
AST: 42 IU/L — ABNORMAL HIGH (ref 0–40)
Albumin: 4.8 g/dL (ref 3.8–4.9)
Alkaline Phosphatase: 98 IU/L (ref 47–123)
Bilirubin Total: 0.7 mg/dL (ref 0.0–1.2)
Bilirubin, Direct: 0.17 mg/dL (ref 0.00–0.40)
Total Protein: 7.8 g/dL (ref 6.0–8.5)

## 2024-08-05 MED ORDER — ROSUVASTATIN CALCIUM 20 MG PO TABS: 20.0000 mg | ORAL_TABLET | Freq: Every day | ORAL | 1 refills | Status: DC

## 2024-08-05 MED ORDER — ROSUVASTATIN CALCIUM 10 MG PO TABS: 10.0000 mg | ORAL_TABLET | Freq: Every day | ORAL | 1 refills | Status: AC

## 2025-02-02 ENCOUNTER — Ambulatory Visit: Admitting: Family Medicine
